# Patient Record
Sex: Female | Born: 1990 | Race: Black or African American | Hispanic: No | Marital: Married | Smoking: Former smoker
Health system: Southern US, Community
[De-identification: ages and names within clinical notes are randomized; demographics above are authoritative.]

---
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
## 2009-07-15 ENCOUNTER — Inpatient Hospital Stay (HOSPITAL_COMMUNITY): Admission: AD | Admit: 2009-07-15 | Discharge: 2009-07-17 | Payer: Self-pay | Admitting: Obstetrics and Gynecology

## 2010-06-24 LAB — CBC
HCT: 37.7 % (ref 36.0–46.0)
Hemoglobin: 12.5 g/dL (ref 12.0–15.0)
MCHC: 33.2 g/dL (ref 30.0–36.0)
MCHC: 34 g/dL (ref 30.0–36.0)
MCV: 91.8 fL (ref 78.0–100.0)
Platelets: 171 10*3/uL (ref 150–400)
Platelets: 185 10*3/uL (ref 150–400)
RDW: 13.2 % (ref 11.5–15.5)
RDW: 13.2 % (ref 11.5–15.5)

## 2010-07-13 LAB — URINE MICROSCOPIC-ADD ON

## 2010-07-13 LAB — URINE CULTURE
Colony Count: 60000
Colony Count: >=100000

## 2010-07-13 LAB — PREGNANCY, URINE: Preg Test, Ur: NEGATIVE

## 2010-07-13 LAB — POCT I-STAT, CHEM 8
Chloride: 107 mEq/L (ref 96–112)
Creatinine, Ser: 1 mg/dL (ref 0.4–1.2)
HCT: 39 % (ref 36.0–46.0)
Hemoglobin: 13.3 g/dL (ref 12.0–15.0)
Potassium: 3.5 mEq/L (ref 3.5–5.1)
Sodium: 141 mEq/L (ref 135–145)

## 2010-07-13 LAB — URINALYSIS, ROUTINE W REFLEX MICROSCOPIC
Bilirubin Urine: NEGATIVE
Ketones, ur: 15 mg/dL — AB
Nitrite: NEGATIVE
Nitrite: NEGATIVE
Protein, ur: NEGATIVE mg/dL
Protein, ur: NEGATIVE mg/dL
Urobilinogen, UA: 1 mg/dL (ref 0.0–1.0)
pH: 6.5 (ref 5.0–8.0)

## 2010-07-13 LAB — POCT PREGNANCY, URINE: Preg Test, Ur: NEGATIVE

## 2010-07-14 LAB — DIFFERENTIAL
Basophils Absolute: 0.1 10*3/uL (ref 0.0–0.1)
Lymphocytes Relative: 30 % (ref 12–46)
Lymphs Abs: 1.4 10*3/uL (ref 0.7–4.0)
Monocytes Absolute: 0.6 10*3/uL (ref 0.1–1.0)
Monocytes Relative: 13 % — ABNORMAL HIGH (ref 3–12)
Neutro Abs: 2.1 10*3/uL (ref 1.7–7.7)

## 2010-07-14 LAB — ETHANOL: Alcohol, Ethyl (B): <5 mg/dL (ref 0–10)

## 2010-07-14 LAB — POCT I-STAT, CHEM 8
BUN: 5 mg/dL — ABNORMAL LOW (ref 6–23)
Calcium, Ion: 1.17 mmol/L (ref 1.12–1.32)
Chloride: 105 mEq/L (ref 96–112)
Glucose, Bld: 86 mg/dL (ref 70–99)

## 2010-07-14 LAB — RAPID URINE DRUG SCREEN, HOSP PERFORMED
Amphetamines: NOT DETECTED
Barbiturates: NOT DETECTED
Benzodiazepines: NOT DETECTED

## 2010-07-14 LAB — URINALYSIS, ROUTINE W REFLEX MICROSCOPIC
Glucose, UA: NEGATIVE mg/dL
Hgb urine dipstick: NEGATIVE
Ketones, ur: 15 mg/dL — AB
Protein, ur: NEGATIVE mg/dL

## 2010-07-14 LAB — CBC
Hemoglobin: 13.1 g/dL (ref 12.0–15.0)
RBC: 4.14 MIL/uL (ref 3.87–5.11)
WBC: 4.5 10*3/uL (ref 4.0–10.5)

## 2010-07-14 LAB — URINE MICROSCOPIC-ADD ON

## 2011-05-08 ENCOUNTER — Encounter (HOSPITAL_COMMUNITY): Payer: Self-pay | Admitting: *Deleted

## 2011-05-08 ENCOUNTER — Emergency Department (HOSPITAL_COMMUNITY)
Admission: EM | Admit: 2011-05-08 | Discharge: 2011-05-08 | Disposition: A | Discharge status: Home / Self Care | Payer: Medicaid Other | Attending: Emergency Medicine | Admitting: Emergency Medicine

## 2011-05-08 DIAGNOSIS — R42 Dizziness and giddiness: Secondary | ICD-10-CM | POA: Insufficient documentation

## 2011-05-08 DIAGNOSIS — R55 Syncope and collapse: Secondary | ICD-10-CM | POA: Insufficient documentation

## 2011-05-08 LAB — POCT I-STAT, CHEM 8
BUN: 11 mg/dL (ref 6–23)
Calcium, Ion: 1.24 mmol/L (ref 1.12–1.32)
Chloride: 107 mEq/L (ref 96–112)
Creatinine, Ser: 0.9 mg/dL (ref 0.50–1.10)
Glucose, Bld: 100 mg/dL — ABNORMAL HIGH (ref 70–99)
HCT: 39 % (ref 36.0–46.0)
Hemoglobin: 13.3 g/dL (ref 12.0–15.0)
Potassium: 4.4 mEq/L (ref 3.5–5.1)
Sodium: 143 mEq/L (ref 135–145)
TCO2: 26 mmol/L (ref 0–100)

## 2011-05-08 LAB — PREGNANCY, URINE: Preg Test, Ur: NEGATIVE

## 2011-05-08 NOTE — ED Notes (Signed)
Pt states that she felt per her norm throughout today then went to a store and began to feel light headed and then syncopied spontaneously.  Pt then awoke on the floor.  Pt admits to a hx of same approximately 2 years ago and was diagnosed as having had a syncopal episode with no underlying conditions.

## 2011-05-08 NOTE — ED Notes (Signed)
Pt feels as if she does not feel as if she drinks enough water and that she may be dehydrated.  Pt also states that she donated plasma today.

## 2011-05-08 NOTE — ED Provider Notes (Signed)
History    21-year-old female with a syncopal event. Patient was out shopping she began feeling sick to her stomach, sweaty and had to sit down. Per friend said she briefly lost consciousness for a period of seconds. Return to baseline did not seem postictal period is no incontinence no tingling. Patient has history seizure disorder. Patient currently with no complaints. Is past medical history. Denies history of blood clot. Denies drug use.  CSN: [id number]    Arrival date & time 05/08/11  2041   First MD Initiated Contact with Patient 05/08/11 2139      Chief Complaint  Patient presents with  . Dizziness    x 1 hr, no other complaints.     (Consider location/radiation/quality/duration/timing/severity/associated sxs/prior treatment) HPI  History reviewed. No pertinent past medical history.  History reviewed. No pertinent past surgical history.  History reviewed. No pertinent family history.  History  Substance Use Topics  . Smoking status: Not on file  . Smokeless tobacco: Not on file  . Alcohol Use: No    OB History    Grav Para Term Preterm Abortions TAB SAB Ect Mult Living                  Review of Systems   Review of symptoms negative unless otherwise noted in HPI.   Allergies  Review of patient's allergies indicates no known allergies.  Home Medications  No current outpatient prescriptions on file.  BP 105/63  Pulse 92  Temp(Src) 97.5 F (36.4 C) (Oral)  Resp 18  SpO2 99%  LMP 05/02/2011  Physical Exam  Nursing note and vitals reviewed. Constitutional: She is oriented to person, place, and time. She appears well-developed and well-nourished. No distress.  HENT:  Head: Normocephalic and atraumatic.  Eyes: Conjunctivae are normal. Pupils are equal, round, and reactive to light. Right eye exhibits no discharge. Left eye exhibits no discharge.  Neck: Neck supple.  Cardiovascular: Normal rate, regular rhythm and normal heart sounds.  Exam reveals no  gallop and no friction rub.   No murmur heard.      Normal rate and rhythm. No murmur appreciated the  Pulmonary/Chest: Effort normal and breath sounds normal. No respiratory distress.  Abdominal: Soft. She exhibits no distension. There is no tenderness.  Musculoskeletal: She exhibits no edema and no tenderness.       L lower extremities are symmetric in appearance. There is no calf tenderness. Negative Homans sign.  Neurological: She is alert and oriented to person, place, and time. No cranial nerve deficit. She exhibits normal muscle tone. Coordination normal.  Skin: Skin is warm and dry.  Psychiatric: She has a normal mood and affect. Her behavior is normal. Thought content normal.    ED Course  Procedures (including critical care time)  Labs Reviewed  POCT I-STAT, CHEM 8 - Abnormal; Notable for the following:    Glucose, Bld 100 (*)    All other components within normal limits  PREGNANCY, URINE   No results found.   1. Syncope   2. Dizziness       MDM  21 year old female with a syncopal event. Suspect vasovagal. Possibly some component dehydration. Normal sinus rhythm and normal intervals. Patient is not pregnant she is nonfocal neurological examination. Distant history suggest seizure. Doubt PE. Outpt fu.        Raeford Razor, MD 05/19/11 [phone]

## 2011-05-08 NOTE — ED Notes (Signed)
Pt c/o lightheadedness x 1 hr. Pt states she had a syncopal episode at grocery store x 1 hr ago. Pt denies n/v/d.

## 2011-09-13 ENCOUNTER — Emergency Department (HOSPITAL_COMMUNITY): Payer: Medicaid Other

## 2011-09-13 ENCOUNTER — Encounter (HOSPITAL_COMMUNITY): Payer: Self-pay | Admitting: *Deleted

## 2011-09-13 ENCOUNTER — Emergency Department (HOSPITAL_COMMUNITY)
Admission: EM | Admit: 2011-09-13 | Discharge: 2011-09-13 | Disposition: A | Discharge status: Home / Self Care | Payer: Medicaid Other | Attending: Emergency Medicine | Admitting: Emergency Medicine

## 2011-09-13 DIAGNOSIS — J069 Acute upper respiratory infection, unspecified: Secondary | ICD-10-CM | POA: Insufficient documentation

## 2011-09-13 DIAGNOSIS — J45909 Unspecified asthma, uncomplicated: Secondary | ICD-10-CM

## 2011-09-13 DIAGNOSIS — R05 Cough: Secondary | ICD-10-CM | POA: Insufficient documentation

## 2011-09-13 DIAGNOSIS — R059 Cough, unspecified: Secondary | ICD-10-CM | POA: Insufficient documentation

## 2011-09-13 DIAGNOSIS — R062 Wheezing: Secondary | ICD-10-CM | POA: Insufficient documentation

## 2011-09-13 MED ORDER — PREDNISONE 20 MG PO TABS: 40.0000 mg | ORAL_TABLET | Freq: Every day | ORAL | Status: DC

## 2011-09-13 MED ORDER — PREDNISONE 20 MG PO TABS
ORAL_TABLET | ORAL | Status: AC
  Administered 2011-09-13: 11:00:00
  Filled 2011-09-13: qty 1

## 2011-09-13 MED ORDER — AZITHROMYCIN 250 MG PO TABS: ORAL_TABLET | ORAL | Status: AC

## 2011-09-13 MED ORDER — ALBUTEROL SULFATE HFA 108 (90 BASE) MCG/ACT IN AERS
2.0000 | INHALATION_SPRAY | RESPIRATORY_TRACT | Status: DC | PRN
  Administered 2011-09-13: 2 via RESPIRATORY_TRACT
  Filled 2011-09-13: qty 6.7

## 2011-09-13 MED ORDER — ALBUTEROL SULFATE (5 MG/ML) 0.5% IN NEBU
2.5000 mg | INHALATION_SOLUTION | Freq: Once | RESPIRATORY_TRACT | Status: AC
  Administered 2011-09-13: 2.5 mg via RESPIRATORY_TRACT
  Filled 2011-09-13: qty 0.5

## 2011-09-13 MED ORDER — IPRATROPIUM BROMIDE 0.02 % IN SOLN
0.5000 mg | Freq: Once | RESPIRATORY_TRACT | Status: AC
  Administered 2011-09-13: 0.5 mg via RESPIRATORY_TRACT
  Filled 2011-09-13: qty 2.5

## 2011-09-13 MED ORDER — PREDNISONE 20 MG PO TABS
60.0000 mg | ORAL_TABLET | Freq: Once | ORAL | Status: AC
  Administered 2011-09-13: 60 mg via ORAL
  Filled 2011-09-13: qty 3

## 2011-09-13 NOTE — Discharge Instructions (Signed)
Mindy Powell we treated you in the ER today with albuterol and atrovent nebulizer.  We also gave you prednisone 60mg .  Use the inhaler no more than 4 times a day.   The chest x-ray shows  R mid lung infection vs nodule.  You will need to get a pcp from the list below and get a CT of the chest without contrast to follow up on this.  We will hold the prednisone rx for now since you do have infection and this could make the infection worse.           Mindy Powell          [phone]   [facility]      [phone] RESOURCE GUIDE  Chronic Pain Problems: Contact [facility]  [phone] Patients need to be referred by their primary care doctor.  Insufficient Money for Medicine: Contact United Way:  call "211" or [facility] [phone].  No Primary Care Doctor: - Call [facility]  [phone] - can help you locate a primary care doctor that  accepts your insurance, provides certain services, etc. - Physician Referral Service- [phone]  Agencies that provide inexpensive medical care: - [facility]  [phone] - [facility]  [phone] - [facility]  [phone] - Women's Clinic  [phone] - Planned Parenthood  [phone] [facility]  [phone]  Medicaid-accepting [facility] Providers: - [facility]- [address]  [phone], Mon-Fri 9am-7pm, Sat 9am-1pm - [facility]- [address] [place], [address] [place]  [phone] - [facility]- [address] [place]  [phone] [facility]- [address]  [phone] - Renaye Rakers- [address] [place], [address], [phone]  Only accepts [place] patients after they have their name  applied to their card  Self Pay (no insurance) in [place]: - Sickle Cell Patients: Dr Willey Blade, [facility]  [address] [place], [phone] - [facility]- [address] [place]  [phone]       [facility] [place]- [address]       -     [facility]- see information above (Speak to [employer] if you do not have insurance)       -  [facility]- [address] [place], [phone]       -  [facility]- [address],  [phone]       -  [facility]- [address] [phone]       -  Dr Julio Sicks-  [address], [place], [phone]       -  [facility]- [address], [phone]       -  [facility]- [address] [phone], also [address], [phone]       -    [facility]- [address] [place], [phone], 1st & 3rd Saturday   every month, 10am-1pm  1) Find a Doctor and Pay Out of Pocket Although you won't have to find out who is covered by your insurance plan, it is a good idea to ask around and get recommendations. You will then need to call the office and see if the doctor you have  chosen will accept you as a new patient and what types of options they offer for patients who are self-pay. Some doctors offer discounts or will set up payment plans for their patients who do not have insurance, but you will need to ask so you aren't surprised when you get to your appointment.  2) Contact Your Local Health Department Not all health departments have doctors that can see patients for sick visits, but many do, so it is worth a call to see if yours does. If you don't know where your local health department is, you can check in your phone book. The CDC also has a tool to help you locate your state's health department, and many state websites also have listings of all of their local health departments.  3) Find a Walk-in Clinic If your illness is not likely to be very severe or complicated, you may want to try a walk in clinic. These are popping up all over the country in pharmacies, drugstores, and shopping  centers. They're usually staffed by nurse practitioners or physician assistants that have been trained to treat common illnesses and complaints. They're usually fairly quick and inexpensive. However, if you have serious medical issues or chronic medical problems, these are probably not your best option  STD Testing - [facility] Department of [facility], STD Clinic, [address] [place], phone [phone] or [phone].  Monday - Friday, call for an appointment. [facility] Department of [employer], STD Clinic, [address], [place], phone [phone] or [phone].  Monday - Friday, call for an appointment.  Abuse/Neglect: [facility] Child Abuse Hotline [phone] [facility] Child Abuse Hotline (940) 160-6014 (After Hours)  Emergency Shelter:  [facility] 231-207-4160  Maternity Homes: - [facility] [phone] - [facility] [phone]  MRSA Hotline #:   610-621-9392  [facility] Resources  [facility] [place]  United Way [facility] Dept. [address]                 [address]         [address] [place] [address]  Blondell Reveal Phone:  [phone]                                  Phone:  [phone]                   Phone:  [phone]  Miriya Cloer [facility], [phone] - [phone]       -     [facility] in [place], [address],                                  [phone], Insurance  [facility] Child Abuse Hotline [phone])  161-0960 or 4324865306 (After Hours)   Behavioral Health Services  Substance Abuse Resources: - [facility] and Drug Services  ([phone] - [facility] 340-060-0021 - [facility]  [phone] Floydene Flock ([phone] - Residential & Outpatient Substance Abuse Program  [phone]  Psychological Services: [facility]  [phone] [facility] Services  [phone] - [facility], [phone] [place]. [address], [place], ACCESS LINE: (973)600-9636 or (785)[phone], [employer]  Dental Assistance  If unable to pay or uninsured, contact:  [facility] or [facility]. to become qualified for the adult dental clinic.  Patients with Medicaid: [facility] [phone] [address], [phone] [address], [phone]  If unable to pay, or uninsured, contact [facility] [phone]) or [facility] Department [phone] in [place], [phone] in [facility]) to become qualified for the adult dental clinic  Other Low-Cost Community Dental Services: - [facility]- [address] [place], [place], [place], [address], [phone], [address]. [phone], 2nd and 4th Thursday of the month at 6:30am.  10 clients each day by appointment, can sometimes see walk-in patients if someone does not show for an appointment. [facility]- [address] [place], [place], [address], [phone] - [facility]- [address], [place], [place], [address], [phone] - [facility]- [phone] [facility] Department- [phone] [facility] Department- [phone]

## 2011-09-13 NOTE — ED Provider Notes (Signed)
History     CSN: [id number]  Arrival date & time 09/13/11  4098   First MD Initiated Contact with Patient 09/13/11 1006      Chief Complaint  Patient presents with  . Nasal Congestion    (Consider location/radiation/quality/duration/timing/severity/associated sxs/prior treatment) Patient is a 21 y.o. female presenting with wheezing. The history is provided by the patient. No language interpreter was used.  Wheezing  The current episode started yesterday. The onset was gradual. The problem occurs continuously. The problem has been unchanged. The problem is moderate. Associated symptoms include chest pain, cough and wheezing. Pertinent negatives include no fever, no sore throat and no shortness of breath. She has not inhaled smoke recently. She has had no prior steroid use. She has had no prior hospitalizations. She has had no prior ICU admissions. She has had no prior intubations. Her past medical history is significant for past wheezing and asthma in the family. Her past medical history does not include asthma or bronchiolitis. She has been behaving normally. There were no sick contacts.  Reports coughing and wheezing x 2 days.  States that her chest hurts when she coughs.  Sore throat yesterday.  No fever n/v/d.  Denies fever but chills and diaphoretic.    History reviewed. No pertinent past medical history.  History reviewed. No pertinent past surgical history.  No family history on file.  History  Substance Use Topics  . Smoking status: Former Games developer  . Smokeless tobacco: Not on file  . Alcohol Use: No    OB History    Grav Para Term Preterm Abortions TAB SAB Ect Mult Living                  Review of Systems  Constitutional: Negative.  Negative for fever.  HENT: Negative.  Negative for sore throat.   Eyes: Negative.   Respiratory: Positive for cough and wheezing. Negative for shortness of breath.   Cardiovascular: Positive for chest pain.  Gastrointestinal: Negative.    Neurological: Negative.   Psychiatric/Behavioral: Negative.   All other systems reviewed and are negative.    Allergies  Review of patient's allergies indicates no known allergies.  Home Medications   Current Outpatient Rx  Name Route Sig Dispense Refill  . PHENYLEPHRINE-DM-GG-APAP 5-10-200-325 MG PO TABS Oral Take 2 tablets by mouth every 6 (six) hours as needed. For cough and cold      BP 138/82  Pulse 96  Temp(Src) 98.5 F (36.9 C) (Oral)  Resp 18  Wt 175 lb (79.379 kg)  SpO2 97%  LMP 08/26/2011  Physical Exam  Nursing note and vitals reviewed. Constitutional: She is oriented to person, place, and time. She appears well-developed and well-nourished.  HENT:  Head: Normocephalic and atraumatic.  Eyes: Conjunctivae and EOM are normal. Pupils are equal, round, and reactive to light.  Neck: Normal range of motion. Neck supple.  Cardiovascular: Normal rate.   Pulmonary/Chest: Effort normal. No respiratory distress. She has wheezes.  Abdominal: Soft.  Musculoskeletal: Normal range of motion. She exhibits no edema and no tenderness.  Neurological: She is alert and oriented to person, place, and time. She has normal reflexes.  Skin: Skin is warm and dry.  Psychiatric: She has a normal mood and affect.    ED Course  Procedures (including critical care time)  Labs Reviewed - No data to display No results found.   No diagnosis found.    MDM  Wheezing and cough treated with albuterol and atrovent in the ER.  jWheezing gone after tmt.  Chest x-ray shows infection R lower base vs nodule.  Recomended CT without contrast follow up.  Albuterol inhaler and zpack.  Return if n/v high fever.  Work note.  Get pcp from list.        Remi Haggard, NP 09/14/11 1042

## 2011-09-13 NOTE — ED Notes (Signed)
Pt states "started having chest congestion yesterday, cough up yellow, had the sore throat initially"

## 2011-09-14 NOTE — ED Provider Notes (Signed)
Medical screening examination/treatment/procedure(s) were performed by non-physician practitioner and as supervising physician I was immediately available for consultation/collaboration.  Gerhard Munch, MD 09/14/11 1217

## 2014-02-01 IMAGING — CR DG CHEST 2V
2 series · 2 of 2 positions shown · non-contrast
Comparison: 09/24/2008.

CLINICAL DATA: Shortness of breath and cough.

CHEST - 2 VIEW

[w chest pa]
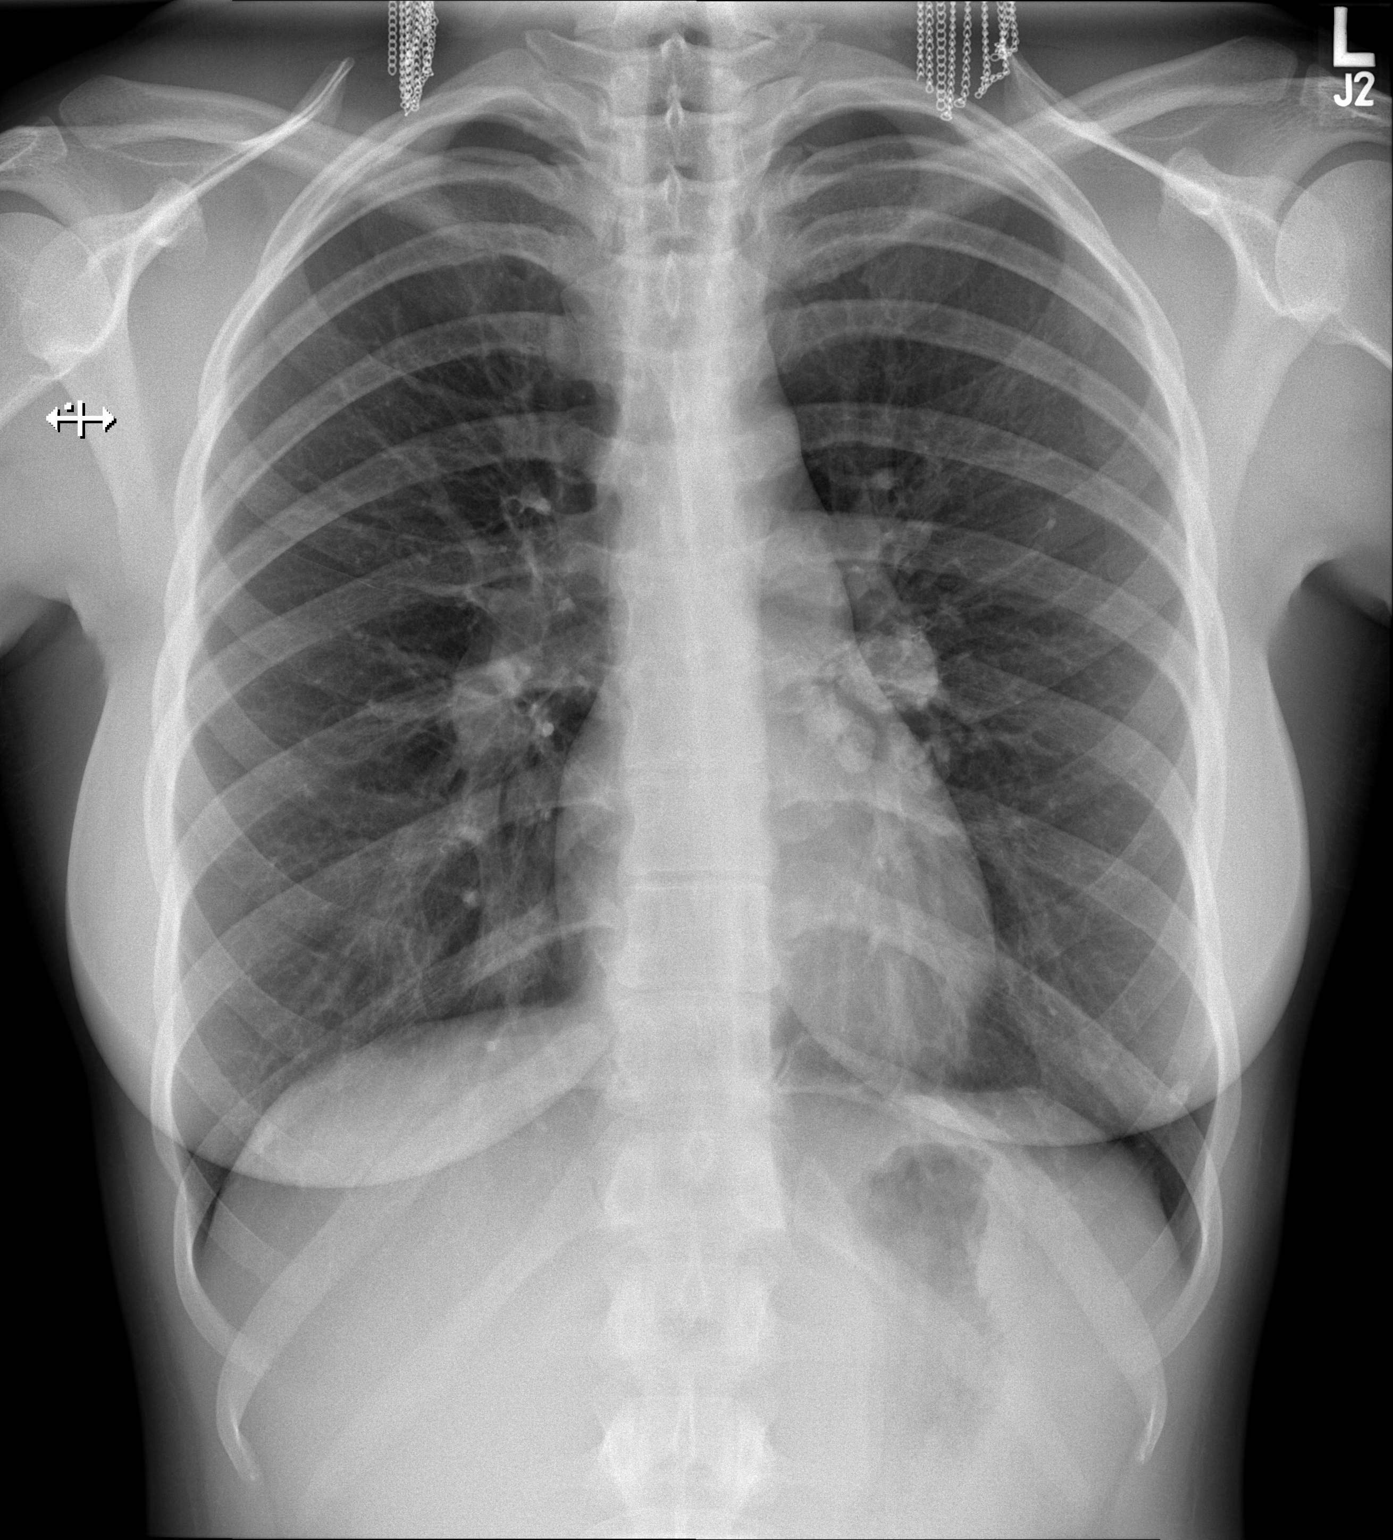

[w chest lat]
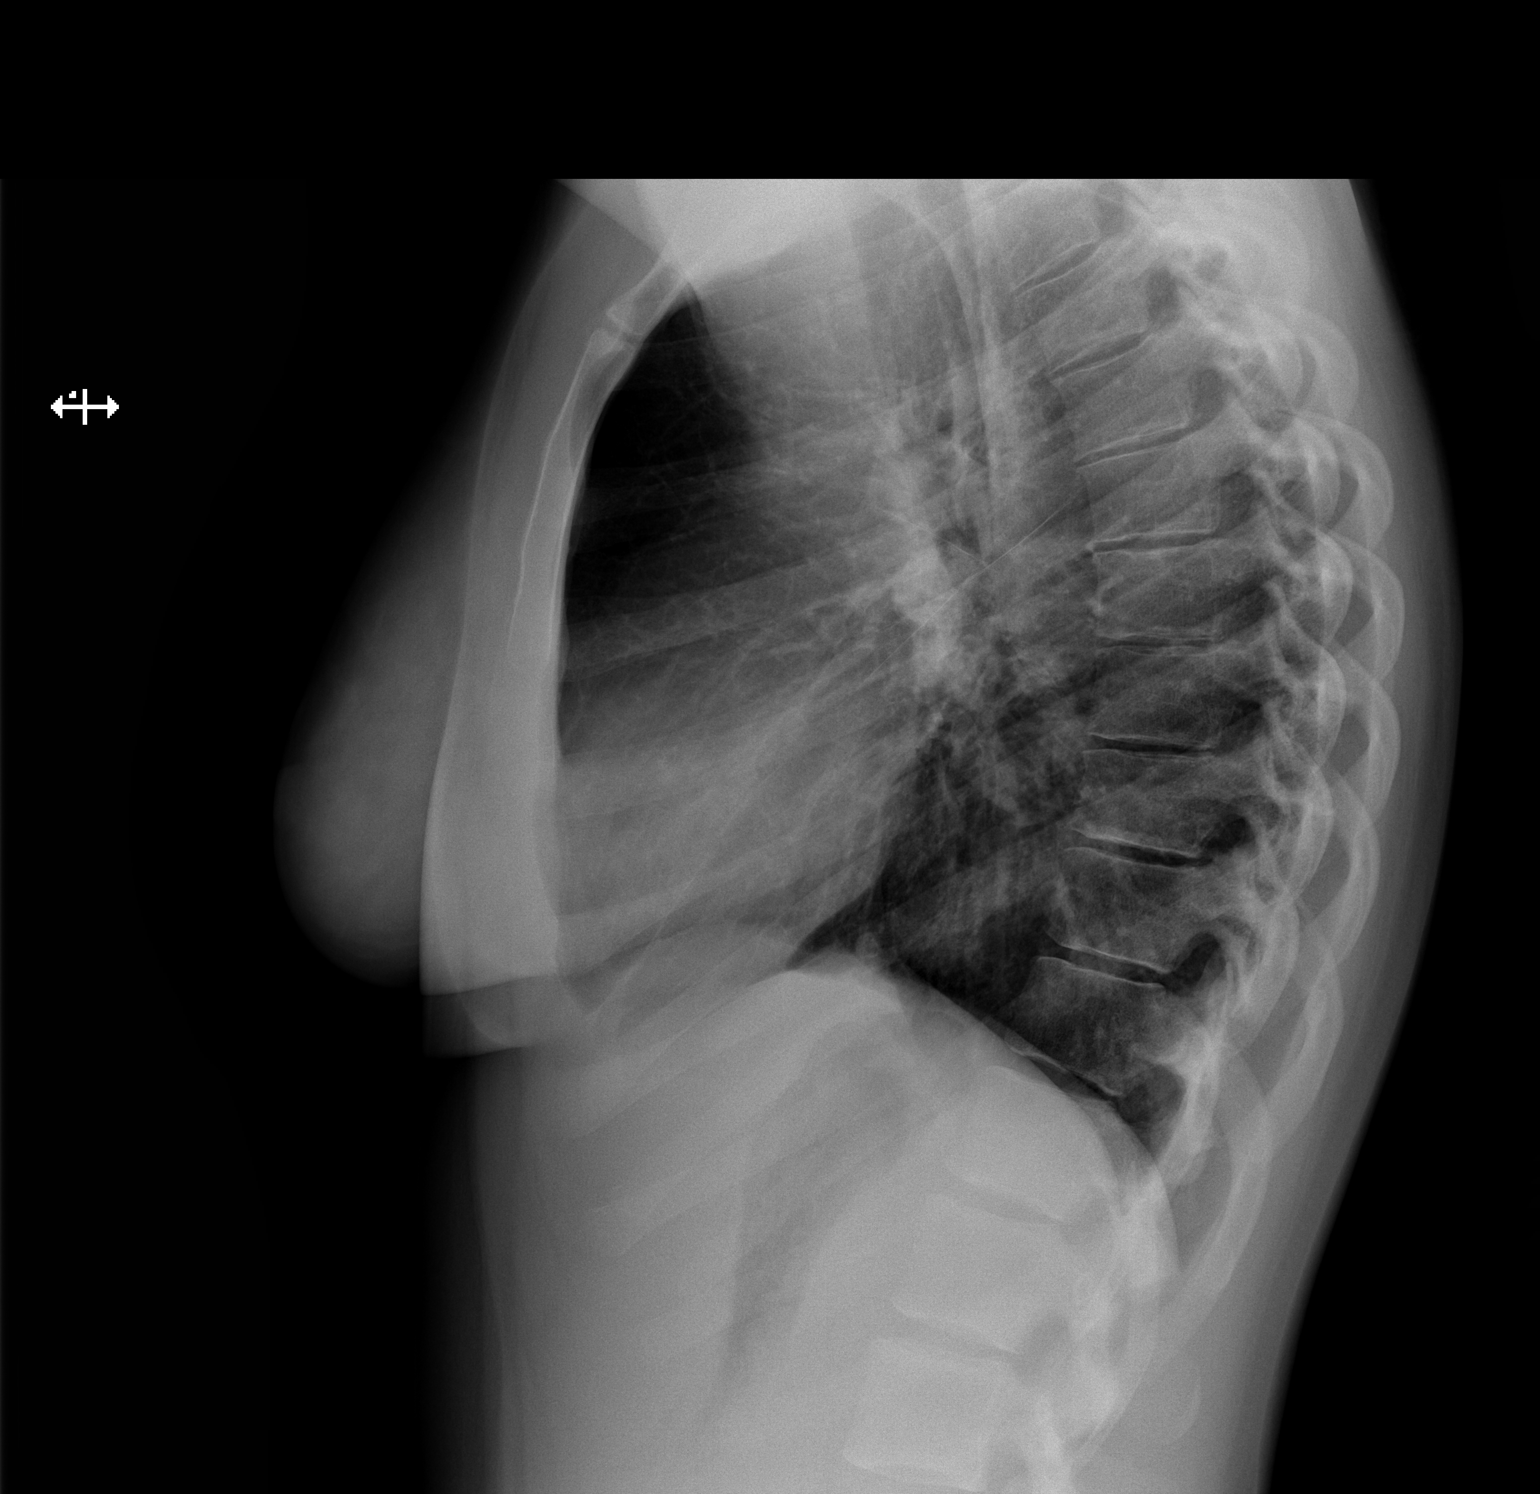

[2 of 2 positions shown; findings below may reference images not displayed]

FINDINGS: Trachea is midline.  Heart size normal.  There are
calcified hilar lymph nodes.  Pulmonary arteries appear mildly
prominent.  Calcified granuloma at the left lung base.  New nodular
airspace opacification in the medial right lung base.  Lungs are
otherwise clear.  No pleural fluid.
IMPRESSION: New nodular airspace opacification at the medial right lung base
may be infectious or inflammatory in etiology. However, a nodule
cannot be excluded.  Follow-up to clearing is recommended.  If a
more aggressive approach is desired, CT chest without contrast
could be performed on a non emergent basis, as clinically
indicated. These results will be called to the ordering clinician
or representative by the Radiologist Assistant, and communication
documented in the PACS Dashboard.

## 2014-12-14 ENCOUNTER — Emergency Department (HOSPITAL_COMMUNITY)
Admission: EM | Admit: 2014-12-14 | Discharge: 2014-12-14 | Disposition: A | Discharge status: Home / Self Care | Payer: Medicaid Other | Attending: Emergency Medicine | Admitting: Emergency Medicine

## 2014-12-14 ENCOUNTER — Encounter (HOSPITAL_COMMUNITY): Payer: Self-pay | Admitting: Emergency Medicine

## 2014-12-14 DIAGNOSIS — K0381 Cracked tooth: Secondary | ICD-10-CM | POA: Insufficient documentation

## 2014-12-14 DIAGNOSIS — K0889 Other specified disorders of teeth and supporting structures: Secondary | ICD-10-CM

## 2014-12-14 DIAGNOSIS — Z87891 Personal history of nicotine dependence: Secondary | ICD-10-CM | POA: Insufficient documentation

## 2014-12-14 DIAGNOSIS — K088 Other specified disorders of teeth and supporting structures: Secondary | ICD-10-CM | POA: Insufficient documentation

## 2014-12-14 MED ORDER — PENICILLIN V POTASSIUM 500 MG PO TABS: 500.0000 mg | ORAL_TABLET | Freq: Three times a day (TID) | ORAL | Status: AC

## 2014-12-14 MED ORDER — IBUPROFEN 800 MG PO TABS: 800.0000 mg | ORAL_TABLET | Freq: Three times a day (TID) | ORAL | Status: AC

## 2014-12-14 NOTE — ED Provider Notes (Signed)
CSN: [id number]     Arrival date & time 12/14/14  1414 History  This chart was scribed for non-physician provider Fayrene Helper, PA-C, working with Laurence Spates, MD by Phillis Haggis, ED Scribe. This patient was seen in room WTR9/WTR9 and patient care was started at 3:19 PM.   Chief Complaint  Patient presents with  . Dental Pain   The history is provided by the patient. No language interpreter was used.  HPI Comments: Mindy Powell is a 24 y.o. female who presents to the Emergency Department complaining of left top constant, sharp, throbbing dental pain onset one week ago. Pt states that she chipped a tooth a few months ago and in the last week, the pain has become more severe and constant along the left upper gum line and the middle of her mouth. Pt reports worsening pain with temperature change, eating and drinking. She reports taking Advil and warm salt water rinses. States that she has an appointment scheduled with her dentist a month from now. Pt denies fever, chills, ear pain, sore throat, or trouble hearing. Pt denies allergies to medications.   History reviewed. No pertinent past medical history. History reviewed. No pertinent past surgical history. History reviewed. No pertinent family history. Social History  Substance Use Topics  . Smoking status: Former Games developer  . Smokeless tobacco: None  . Alcohol Use: No   OB History    No data available     Review of Systems  Constitutional: Negative for fever and chills.  HENT: Positive for dental problem. Negative for ear pain, hearing loss and sore throat.       Allergies  Review of patient's allergies indicates no known allergies.  Home Medications   Prior to Admission medications   Medication Sig Start Date End Date Taking? Authorizing Provider  azithromycin (ZITHROMAX Z-PAK) 250 MG tablet Take 2 pills today and 1 pill every day until gone 09/13/11   Jethro Bastos, NP  Phenylephrine-DM-GG-APAP (TYLENOL COLD/FLU  SEVERE) 5-10-200-325 MG TABS Take 2 tablets by mouth every 6 (six) hours as needed. For cough and cold    Historical Provider, MD   BP 104/66 mmHg  Pulse 64  Temp(Src) 98 F (36.7 C) (Oral)  Resp 20  SpO2 99%  LMP 11/25/2014 (Approximate)  Physical Exam  Constitutional: She appears well-developed and well-nourished. No distress.  HENT:  Head: Normocephalic and atraumatic.  Right Ear: External ear normal.  Left Ear: External ear normal.  Tooth #14 with old dental fracture involving 30% of tooth, tenderness to palpation, no gingival erythema or abscess noted.   Eyes: EOM are normal. Pupils are equal, round, and reactive to light.  Neck: Normal range of motion. Neck supple.  Pulmonary/Chest: Effort normal.  Neurological: She is alert.  Skin: She is not diaphoretic.  Nursing note and vitals reviewed.   ED Course  Procedures (including critical care time) DIAGNOSTIC STUDIES: Oxygen Saturation is 99% on RA, normal by my interpretation.    COORDINATION OF CARE: 3:21 PM-Discussed treatment plan which includes anti-biotics and follow up with on call dentist with pt at bedside and pt agreed to plan.   Labs Review Labs Reviewed - No data to display  Imaging Review No results found.    EKG Interpretation None      MDM   Final diagnoses:  None   Pt is a 24 year old female who presents to the ED with an old dental fracture to the 14th tooth on the left upper jaw. She states  that the pain has worsened in the past week. There were no abscesses found, so I will prescribe penicillin to treat any possible underlying infection to the area. I have discussed return precautions and following up with the on call dentist so she can be seen sooner and pt has agreed to plan.   BP 107/61 mmHg  Pulse 58  Temp(Src) 98.5 F (36.9 C) (Oral)  Resp 22  SpO2 100%  LMP 11/25/2014 (Approximate)  I personally performed the services described in this documentation, which was scribed in my  presence. The recorded information has been reviewed and is accurate.    Fayrene Helper, PA-C 12/14/14 2000  Laurence Spates, MD 12/14/14 [phone]

## 2014-12-14 NOTE — Discharge Instructions (Signed)

## 2014-12-14 NOTE — ED Notes (Signed)
Pt c/o dental pain to left top of mouth, has chipped tooth.

## 2019-10-04 DIAGNOSIS — Z419 Encounter for procedure for purposes other than remedying health state, unspecified: Secondary | ICD-10-CM | POA: Diagnosis not present

## 2019-11-04 DIAGNOSIS — Z419 Encounter for procedure for purposes other than remedying health state, unspecified: Secondary | ICD-10-CM | POA: Diagnosis not present

## 2019-12-05 DIAGNOSIS — Z419 Encounter for procedure for purposes other than remedying health state, unspecified: Secondary | ICD-10-CM | POA: Diagnosis not present

## 2020-01-04 DIAGNOSIS — Z419 Encounter for procedure for purposes other than remedying health state, unspecified: Secondary | ICD-10-CM | POA: Diagnosis not present

## 2020-02-04 DIAGNOSIS — Z419 Encounter for procedure for purposes other than remedying health state, unspecified: Secondary | ICD-10-CM | POA: Diagnosis not present

## 2020-03-05 DIAGNOSIS — Z419 Encounter for procedure for purposes other than remedying health state, unspecified: Secondary | ICD-10-CM | POA: Diagnosis not present

## 2020-04-05 DIAGNOSIS — Z419 Encounter for procedure for purposes other than remedying health state, unspecified: Secondary | ICD-10-CM | POA: Diagnosis not present

## 2020-05-06 DIAGNOSIS — Z419 Encounter for procedure for purposes other than remedying health state, unspecified: Secondary | ICD-10-CM | POA: Diagnosis not present

## 2020-06-03 DIAGNOSIS — Z419 Encounter for procedure for purposes other than remedying health state, unspecified: Secondary | ICD-10-CM | POA: Diagnosis not present

## 2020-07-04 DIAGNOSIS — Z419 Encounter for procedure for purposes other than remedying health state, unspecified: Secondary | ICD-10-CM | POA: Diagnosis not present

## 2020-08-03 DIAGNOSIS — Z419 Encounter for procedure for purposes other than remedying health state, unspecified: Secondary | ICD-10-CM | POA: Diagnosis not present

## 2020-09-03 DIAGNOSIS — Z419 Encounter for procedure for purposes other than remedying health state, unspecified: Secondary | ICD-10-CM | POA: Diagnosis not present

## 2020-10-03 DIAGNOSIS — Z419 Encounter for procedure for purposes other than remedying health state, unspecified: Secondary | ICD-10-CM | POA: Diagnosis not present

## 2020-11-03 DIAGNOSIS — Z419 Encounter for procedure for purposes other than remedying health state, unspecified: Secondary | ICD-10-CM | POA: Diagnosis not present

## 2020-12-04 DIAGNOSIS — Z419 Encounter for procedure for purposes other than remedying health state, unspecified: Secondary | ICD-10-CM | POA: Diagnosis not present

## 2021-01-03 DIAGNOSIS — Z419 Encounter for procedure for purposes other than remedying health state, unspecified: Secondary | ICD-10-CM | POA: Diagnosis not present

## 2021-02-03 DIAGNOSIS — Z419 Encounter for procedure for purposes other than remedying health state, unspecified: Secondary | ICD-10-CM | POA: Diagnosis not present

## 2021-03-05 DIAGNOSIS — Z419 Encounter for procedure for purposes other than remedying health state, unspecified: Secondary | ICD-10-CM | POA: Diagnosis not present

## 2021-04-05 DIAGNOSIS — Z419 Encounter for procedure for purposes other than remedying health state, unspecified: Secondary | ICD-10-CM | POA: Diagnosis not present

## 2021-05-06 DIAGNOSIS — Z419 Encounter for procedure for purposes other than remedying health state, unspecified: Secondary | ICD-10-CM | POA: Diagnosis not present

## 2021-06-03 DIAGNOSIS — Z419 Encounter for procedure for purposes other than remedying health state, unspecified: Secondary | ICD-10-CM | POA: Diagnosis not present

## 2021-07-04 DIAGNOSIS — Z419 Encounter for procedure for purposes other than remedying health state, unspecified: Secondary | ICD-10-CM | POA: Diagnosis not present

## 2021-08-03 DIAGNOSIS — Z419 Encounter for procedure for purposes other than remedying health state, unspecified: Secondary | ICD-10-CM | POA: Diagnosis not present

## 2021-09-03 DIAGNOSIS — Z419 Encounter for procedure for purposes other than remedying health state, unspecified: Secondary | ICD-10-CM | POA: Diagnosis not present

## 2021-10-03 DIAGNOSIS — Z419 Encounter for procedure for purposes other than remedying health state, unspecified: Secondary | ICD-10-CM | POA: Diagnosis not present

## 2021-11-03 DIAGNOSIS — Z419 Encounter for procedure for purposes other than remedying health state, unspecified: Secondary | ICD-10-CM | POA: Diagnosis not present

## 2021-12-04 DIAGNOSIS — Z419 Encounter for procedure for purposes other than remedying health state, unspecified: Secondary | ICD-10-CM | POA: Diagnosis not present

## 2022-01-03 DIAGNOSIS — Z419 Encounter for procedure for purposes other than remedying health state, unspecified: Secondary | ICD-10-CM | POA: Diagnosis not present

## 2022-02-03 DIAGNOSIS — Z419 Encounter for procedure for purposes other than remedying health state, unspecified: Secondary | ICD-10-CM | POA: Diagnosis not present

## 2022-03-05 DIAGNOSIS — Z419 Encounter for procedure for purposes other than remedying health state, unspecified: Secondary | ICD-10-CM | POA: Diagnosis not present

## 2022-04-05 DIAGNOSIS — Z419 Encounter for procedure for purposes other than remedying health state, unspecified: Secondary | ICD-10-CM | POA: Diagnosis not present

## 2022-05-06 DIAGNOSIS — Z419 Encounter for procedure for purposes other than remedying health state, unspecified: Secondary | ICD-10-CM | POA: Diagnosis not present

## 2022-06-04 DIAGNOSIS — Z419 Encounter for procedure for purposes other than remedying health state, unspecified: Secondary | ICD-10-CM | POA: Diagnosis not present

## 2022-07-05 DIAGNOSIS — Z419 Encounter for procedure for purposes other than remedying health state, unspecified: Secondary | ICD-10-CM | POA: Diagnosis not present

## 2022-08-04 DIAGNOSIS — Z419 Encounter for procedure for purposes other than remedying health state, unspecified: Secondary | ICD-10-CM | POA: Diagnosis not present

## 2022-09-04 DIAGNOSIS — Z419 Encounter for procedure for purposes other than remedying health state, unspecified: Secondary | ICD-10-CM | POA: Diagnosis not present

## 2022-10-04 DIAGNOSIS — Z419 Encounter for procedure for purposes other than remedying health state, unspecified: Secondary | ICD-10-CM | POA: Diagnosis not present

## 2022-11-04 DIAGNOSIS — Z419 Encounter for procedure for purposes other than remedying health state, unspecified: Secondary | ICD-10-CM | POA: Diagnosis not present

## 2022-12-05 DIAGNOSIS — Z419 Encounter for procedure for purposes other than remedying health state, unspecified: Secondary | ICD-10-CM | POA: Diagnosis not present

## 2023-01-04 DIAGNOSIS — Z419 Encounter for procedure for purposes other than remedying health state, unspecified: Secondary | ICD-10-CM | POA: Diagnosis not present

## 2023-02-04 DIAGNOSIS — Z419 Encounter for procedure for purposes other than remedying health state, unspecified: Secondary | ICD-10-CM | POA: Diagnosis not present

## 2023-03-06 DIAGNOSIS — Z419 Encounter for procedure for purposes other than remedying health state, unspecified: Secondary | ICD-10-CM | POA: Diagnosis not present

## 2023-04-06 DIAGNOSIS — Z419 Encounter for procedure for purposes other than remedying health state, unspecified: Secondary | ICD-10-CM | POA: Diagnosis not present

## 2023-05-07 DIAGNOSIS — Z419 Encounter for procedure for purposes other than remedying health state, unspecified: Secondary | ICD-10-CM | POA: Diagnosis not present

## 2023-06-04 DIAGNOSIS — Z419 Encounter for procedure for purposes other than remedying health state, unspecified: Secondary | ICD-10-CM | POA: Diagnosis not present

## 2023-07-16 DIAGNOSIS — Z419 Encounter for procedure for purposes other than remedying health state, unspecified: Secondary | ICD-10-CM | POA: Diagnosis not present

## 2023-08-15 DIAGNOSIS — Z419 Encounter for procedure for purposes other than remedying health state, unspecified: Secondary | ICD-10-CM | POA: Diagnosis not present

## 2023-09-15 DIAGNOSIS — Z419 Encounter for procedure for purposes other than remedying health state, unspecified: Secondary | ICD-10-CM | POA: Diagnosis not present

## 2023-10-15 DIAGNOSIS — Z419 Encounter for procedure for purposes other than remedying health state, unspecified: Secondary | ICD-10-CM | POA: Diagnosis not present

## 2023-11-15 DIAGNOSIS — Z419 Encounter for procedure for purposes other than remedying health state, unspecified: Secondary | ICD-10-CM | POA: Diagnosis not present

## 2023-12-16 DIAGNOSIS — Z419 Encounter for procedure for purposes other than remedying health state, unspecified: Secondary | ICD-10-CM | POA: Diagnosis not present
